# Patient Record
Sex: Male | Born: 1975 | Race: Black or African American | Hispanic: No | Marital: Married | Smoking: Former smoker
Health system: Southern US, Community
[De-identification: ages and names within clinical notes are randomized; demographics above are authoritative.]

## PROBLEM LIST (undated history)

## (undated) DIAGNOSIS — I10 Essential (primary) hypertension: Secondary | ICD-10-CM

---

## 2011-03-28 ENCOUNTER — Encounter: Payer: Self-pay | Admitting: *Deleted

## 2011-03-28 ENCOUNTER — Emergency Department (HOSPITAL_BASED_OUTPATIENT_CLINIC_OR_DEPARTMENT_OTHER)
Admission: EM | Admit: 2011-03-28 | Discharge: 2011-03-29 | Disposition: A | Discharge status: Home / Self Care | Payer: Self-pay | Attending: Emergency Medicine | Admitting: Emergency Medicine

## 2011-03-28 ENCOUNTER — Emergency Department (INDEPENDENT_AMBULATORY_CARE_PROVIDER_SITE_OTHER): Payer: Self-pay

## 2011-03-28 DIAGNOSIS — Z87891 Personal history of nicotine dependence: Secondary | ICD-10-CM

## 2011-03-28 DIAGNOSIS — R05 Cough: Secondary | ICD-10-CM | POA: Insufficient documentation

## 2011-03-28 DIAGNOSIS — R059 Cough, unspecified: Secondary | ICD-10-CM | POA: Insufficient documentation

## 2011-03-28 DIAGNOSIS — R0602 Shortness of breath: Secondary | ICD-10-CM | POA: Insufficient documentation

## 2011-03-28 DIAGNOSIS — J40 Bronchitis, not specified as acute or chronic: Secondary | ICD-10-CM | POA: Insufficient documentation

## 2011-03-28 MED ORDER — AZITHROMYCIN 250 MG PO TABS: 250.0000 mg | ORAL_TABLET | Freq: Every day | ORAL | Status: AC

## 2011-03-28 MED ORDER — ALBUTEROL SULFATE HFA 108 (90 BASE) MCG/ACT IN AERS: 1.0000 | INHALATION_SPRAY | Freq: Four times a day (QID) | RESPIRATORY_TRACT | Status: AC | PRN

## 2011-03-28 NOTE — ED Notes (Signed)
Pt c/o severe cough x 2 weeks

## 2011-03-28 NOTE — ED Notes (Signed)
MD at bedside. 

## 2011-03-28 NOTE — ED Notes (Signed)
PA at bedside to evaluate pt now. 

## 2011-03-28 NOTE — ED Provider Notes (Signed)
History     CSN: [id number] Arrival date & time: 03/28/2011  9:07 PM  Chief Complaint  Patient presents with  . Cough   Patient is a 35 y.o. male presenting with cough. The history is provided by the patient.  Cough This is a new problem. The current episode started more than 1 week ago. The problem occurs constantly. The problem has been gradually worsening. The cough is productive of sputum. There has been no fever. Associated symptoms include shortness of breath. He has tried nothing for the symptoms. The treatment provided no relief. He is not a smoker. His past medical history does not include bronchitis, pneumonia, bronchiectasis, emphysema or asthma.    History reviewed. No pertinent past medical history.  History reviewed. No pertinent past surgical history.  History reviewed. No pertinent family history.  History  Substance Use Topics  . Smoking status: Never Smoker   . Smokeless tobacco: Not on file  . Alcohol Use: No      Review of Systems  Respiratory: Positive for cough and shortness of breath.   All other systems reviewed and are negative.    Physical Exam  BP 121/87  Pulse 73  Temp(Src) 98.4 F (36.9 C) (Oral)  Resp 16  Wt 250 lb (113.399 kg)  SpO2 96%  Physical Exam  Nursing note and vitals reviewed. Constitutional: He is oriented to person, place, and time. He appears well-developed and well-nourished.  HENT:  Head: Normocephalic and atraumatic.  Mouth/Throat: Oropharynx is clear and moist.  Eyes: Conjunctivae are normal. Pupils are equal, round, and reactive to light.  Neck: Normal range of motion. Neck supple.  Cardiovascular: Normal rate.   Pulmonary/Chest: He is in respiratory distress.  Abdominal: Soft.  Musculoskeletal: Normal range of motion.  Neurological: He is alert and oriented to person, place, and time.  Skin: Skin is warm.  Psychiatric: He has a normal mood and affect.    ED Course  Procedures  MDM Chest xray  Negative per  radiologist      Langston Masker, PA 03/29/11 0007

## 2011-03-29 NOTE — ED Provider Notes (Signed)
Medical screening examination/treatment/procedure(s) were performed by non-physician practitioner and as supervising physician I was immediately available for consultation/collaboration.  Geoffery Lyons, MD 03/29/11 [phone]

## 2011-06-10 ENCOUNTER — Encounter (HOSPITAL_BASED_OUTPATIENT_CLINIC_OR_DEPARTMENT_OTHER): Payer: Self-pay | Admitting: *Deleted

## 2011-06-10 ENCOUNTER — Emergency Department (HOSPITAL_BASED_OUTPATIENT_CLINIC_OR_DEPARTMENT_OTHER)
Admission: EM | Admit: 2011-06-10 | Discharge: 2011-06-10 | Disposition: A | Discharge status: Home / Self Care | Payer: Self-pay | Attending: Emergency Medicine | Admitting: Emergency Medicine

## 2011-06-10 DIAGNOSIS — R05 Cough: Secondary | ICD-10-CM

## 2011-06-10 DIAGNOSIS — IMO0001 Reserved for inherently not codable concepts without codable children: Secondary | ICD-10-CM | POA: Insufficient documentation

## 2011-06-10 DIAGNOSIS — R062 Wheezing: Secondary | ICD-10-CM | POA: Insufficient documentation

## 2011-06-10 DIAGNOSIS — R059 Cough, unspecified: Secondary | ICD-10-CM | POA: Insufficient documentation

## 2011-06-10 DIAGNOSIS — R6883 Chills (without fever): Secondary | ICD-10-CM | POA: Insufficient documentation

## 2011-06-10 DIAGNOSIS — J029 Acute pharyngitis, unspecified: Secondary | ICD-10-CM | POA: Insufficient documentation

## 2011-06-10 DIAGNOSIS — R0602 Shortness of breath: Secondary | ICD-10-CM | POA: Insufficient documentation

## 2011-06-10 MED ORDER — ALBUTEROL SULFATE HFA 108 (90 BASE) MCG/ACT IN AERS: 1.0000 | INHALATION_SPRAY | Freq: Four times a day (QID) | RESPIRATORY_TRACT | Status: DC | PRN

## 2011-06-10 MED ORDER — DOXYCYCLINE HYCLATE 100 MG PO CAPS: 100.0000 mg | ORAL_CAPSULE | Freq: Two times a day (BID) | ORAL | Status: AC

## 2011-06-10 NOTE — ED Notes (Signed)
Pt reports 3 weeks of cough producing yellow sputum, denies any fevers or other c/o.

## 2011-06-10 NOTE — ED Provider Notes (Signed)
History     CSN: [id number] Arrival date & time: 06/10/2011  7:47 AM   First MD Initiated Contact with Patient 06/10/11 0750    Chief Complaint  Patient presents with  . Cough    Patient is a 35 y.o. male presenting with cough. The history is provided by the patient.  Cough This is a recurrent problem. The current episode started more than 1 week ago. The problem occurs hourly. The problem has not changed since onset.The cough is productive of sputum. Associated symptoms include chills, sore throat, myalgias, shortness of breath and wheezing. Pertinent negatives include no chest pain and no sweats.   patient has had intermittent cough for 3 weeks.  Cough worse at nights No hemoptysis No dyspnea on exertion No syncope No chest pain Does report intermittent chills Does report nasal congestion He is a smoker No recent travel/surgery  PMH - none Fam Hx - noncontributory  History  Substance Use Topics  . Smoking status: Current Everyday Smoker  . Smokeless tobacco: Not on file  . Alcohol Use: No      Review of Systems  Constitutional: Positive for chills.  HENT: Positive for sore throat.   Respiratory: Positive for cough, shortness of breath and wheezing.   Cardiovascular: Negative for chest pain.  Musculoskeletal: Positive for myalgias.    Allergies  Review of patient's allergies indicates no known allergies.  Home Medications   Current Outpatient Rx  Name Route Sig Dispense Refill  . ALBUTEROL SULFATE HFA 108 (90 BASE) MCG/ACT IN AERS Inhalation Inhale 2 puffs into the lungs every 6 (six) hours as needed. For shortness of breath     . GUAIFENESIN-DM 100-10 MG/5ML PO SYRP Oral Take 10 mLs by mouth 3 (three) times daily as needed. For cough     . ALBUTEROL SULFATE HFA 108 (90 BASE) MCG/ACT IN AERS Inhalation Inhale 1-2 puffs into the lungs every 6 (six) hours as needed for wheezing. 1 Inhaler 0  . ALBUTEROL SULFATE HFA 108 (90 BASE) MCG/ACT IN AERS Inhalation Inhale  1-2 puffs into the lungs every 6 (six) hours as needed for wheezing. 1 Inhaler 0  . DOXYCYCLINE HYCLATE 100 MG PO CAPS Oral Take 1 capsule (100 mg total) by mouth 2 (two) times daily. 14 capsule 0    BP 141/77  Pulse 84  Temp(Src) 98.9 F (37.2 C) (Oral)  Resp 16  SpO2 100%  Physical Exam CONSTITUTIONAL: Well developed/well nourished HEAD AND FACE: Normocephalic/atraumatic EYES: EOMI/PERRL ENMT: Mucous membranes moist, nasal congestion NECK: supple no meningeal signs CV: S1/S2 noted, no murmurs/rubs/gallops noted LUNGS:brief scattered wheezes bilaterally, no rales, no tachypnea, he is able to speak to me clearly ABDOMEN: soft, nontender, no rebound or guarding NEURO: Pt is awake/alert, moves all extremitiesx4 EXTREMITIES: pulses normal, full ROM, no edema SKIN: warm, color normal PSYCH: no abnormalities of mood noted  ED Course  Procedures    1. Cough       MDM  Nursing notes reviewed and considered in documentation Previous records reviewed and considered  Pt well appearing, ambulatory and no SOB on exertion while in the ED Given he has had cough for 3 weeks with greenish sputum, will start abx, give albuterol mdi Suspicion for ACS/CHF/PE is low given history/exam  The patient appears reasonably screened and/or stabilized for discharge and I doubt any other medical condition or other [facility] requiring further screening, evaluation, or treatment in the ED at this time prior to discharge.         Dorinda Hill  Forestine Chute, MD 06/10/11 [phone]

## 2012-01-10 IMAGING — CR DG CHEST 2V
2 series · 2 of 2 positions shown · non-contrast
Comparison: None.

CLINICAL DATA: Productive cough; history of smoking.

CHEST - 2 VIEW

[w chest pa]
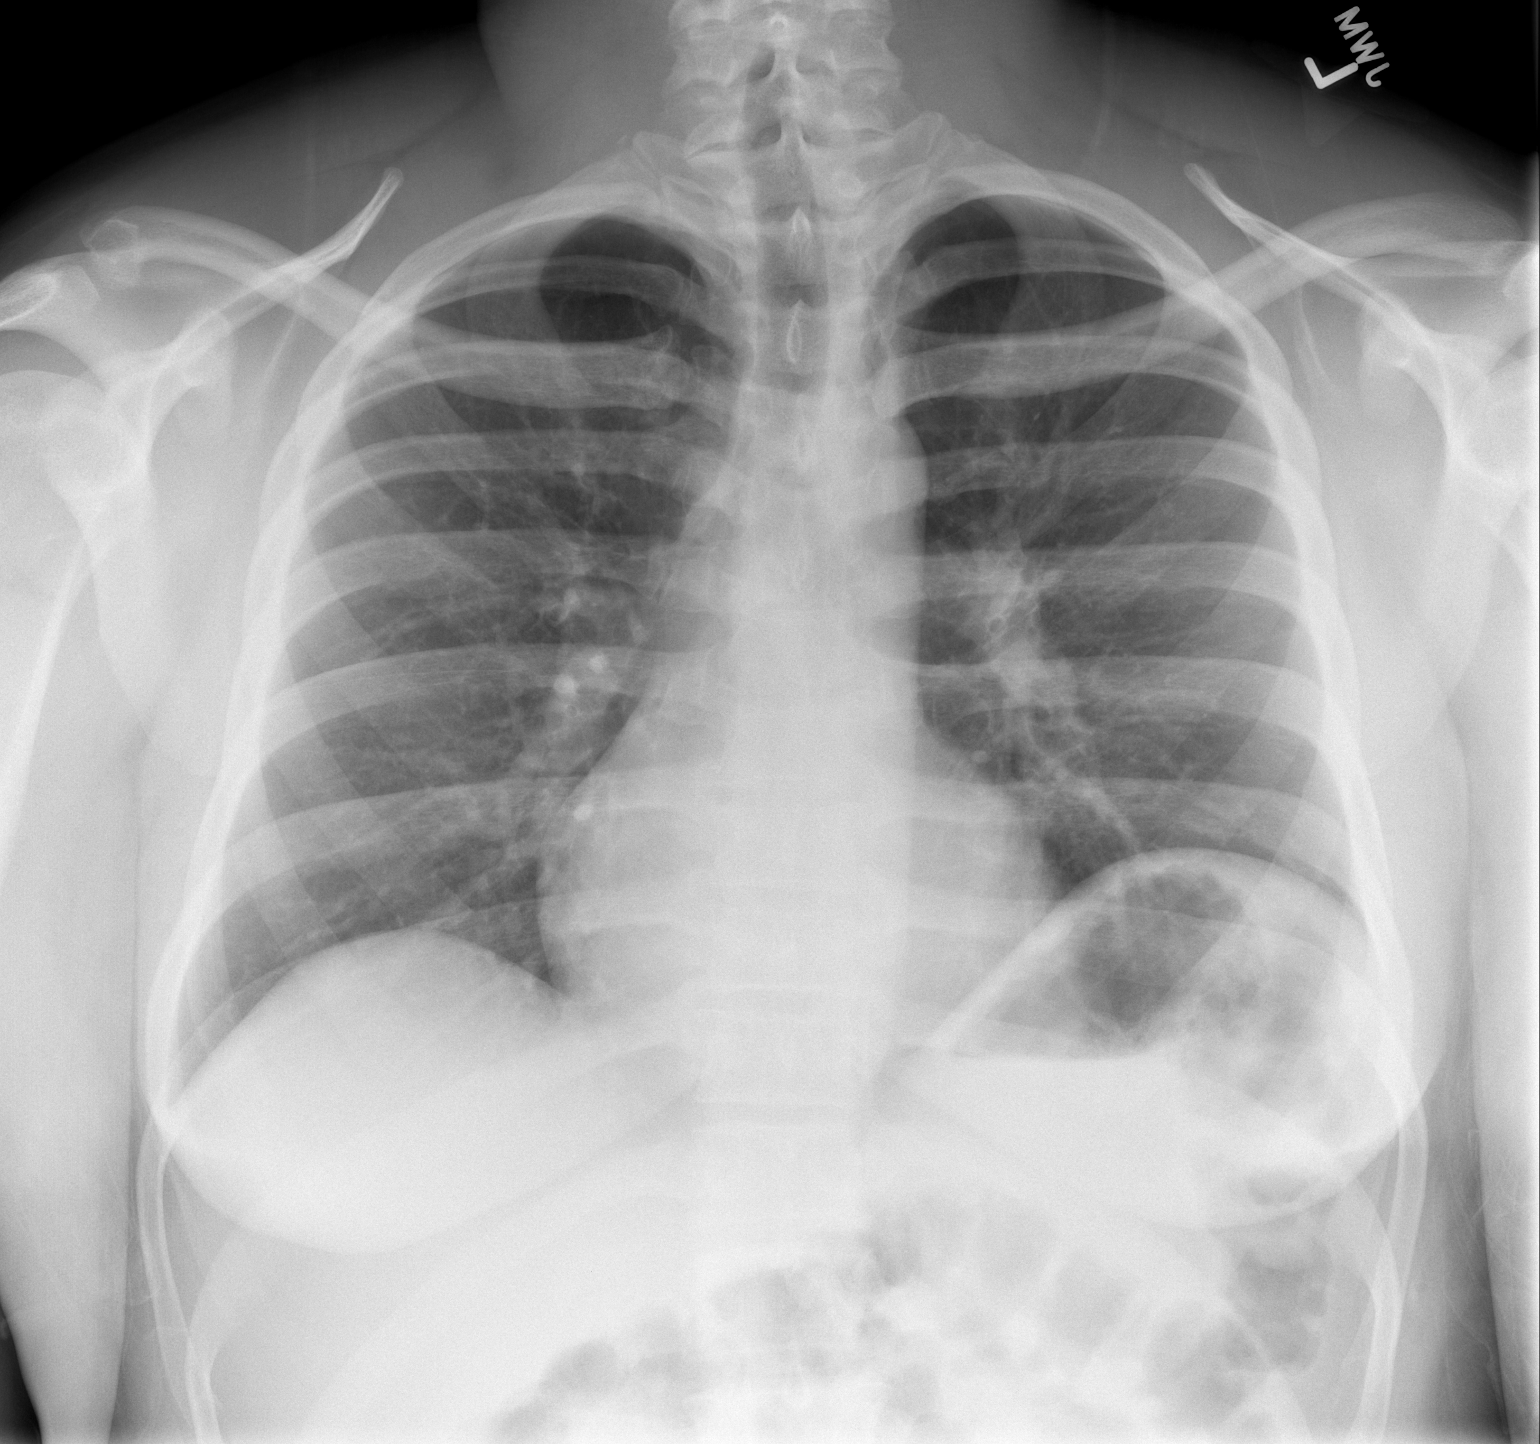

[w chest lat]
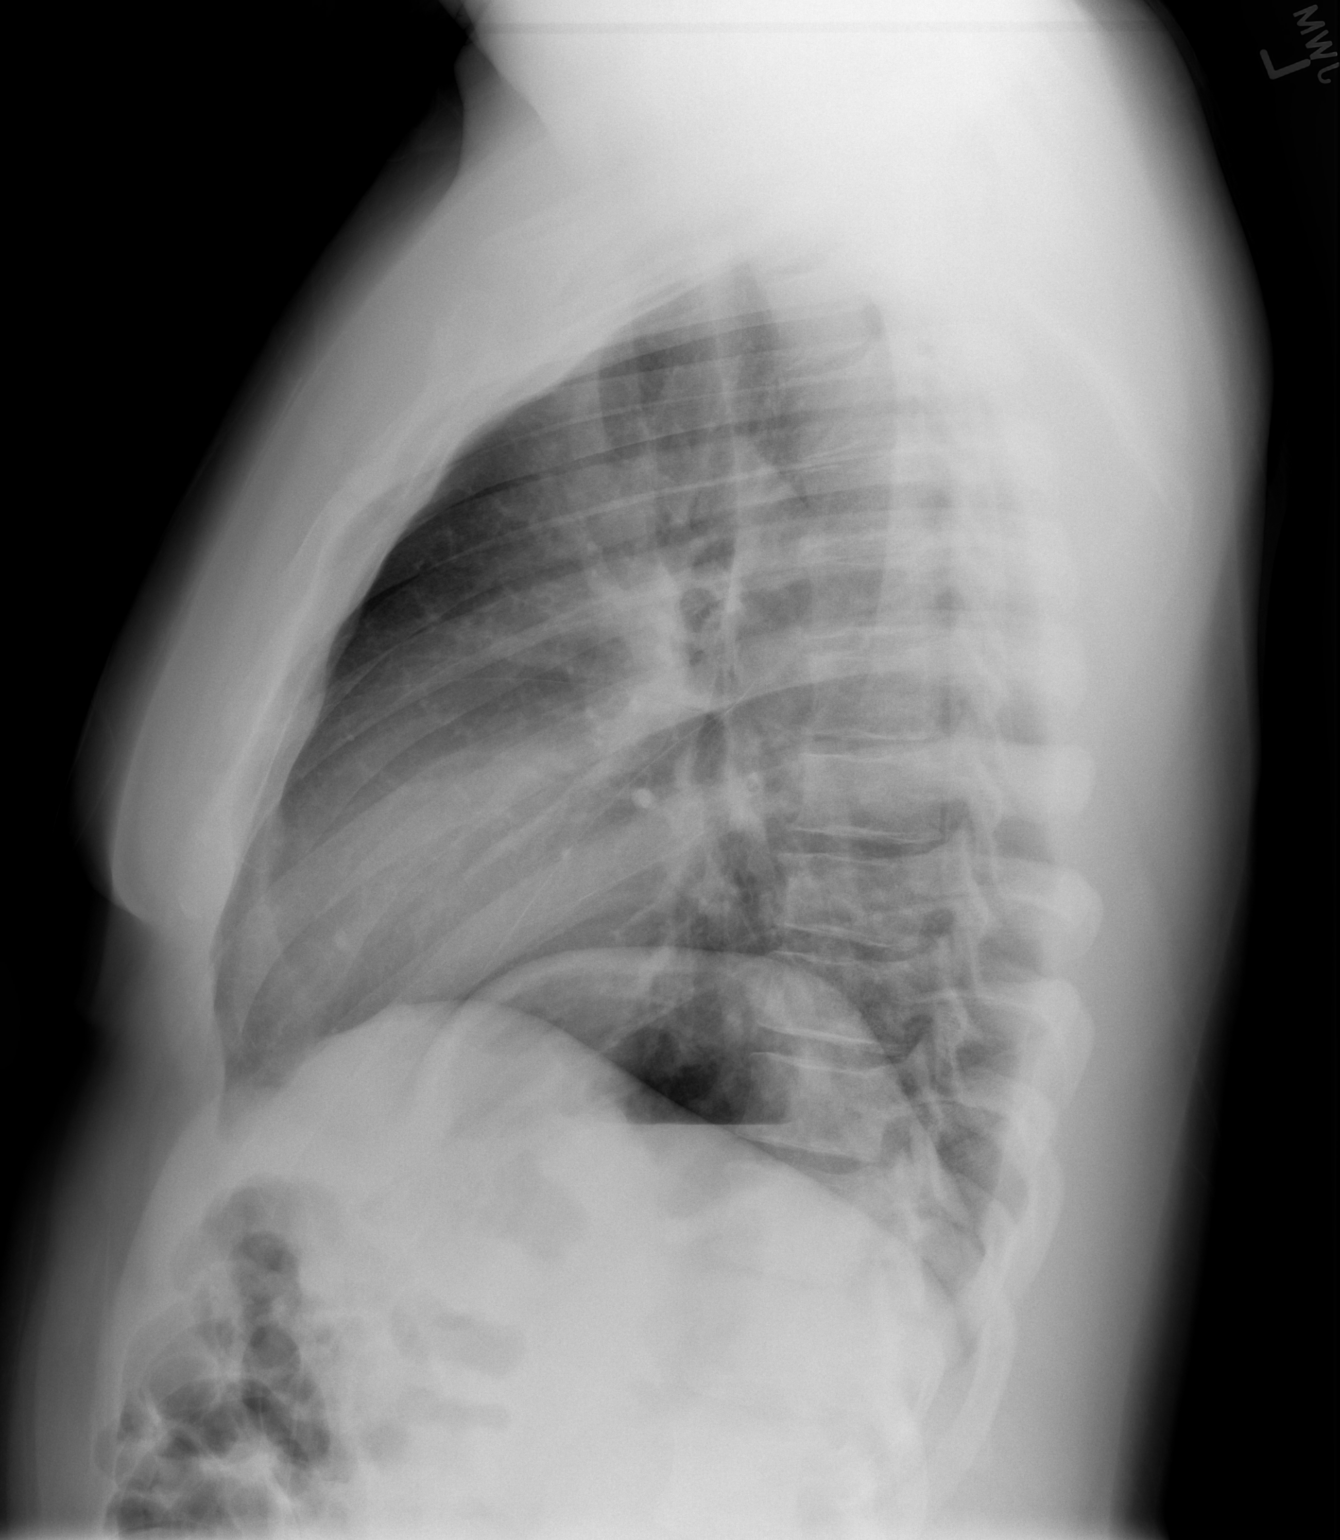

[2 of 2 positions shown; findings below may reference images not displayed]

FINDINGS: The lungs are well-aerated; mild peribronchial thickening
is noted.  There is no evidence of focal opacification, pleural
effusion or pneumothorax.  There is mild elevation of the left
hemidiaphragm.

The heart is normal in size; the mediastinal contour is within
normal limits.  No acute osseous abnormalities are seen.
IMPRESSION: Mild peribronchial thickening noted; lungs otherwise clear.

## 2012-03-21 ENCOUNTER — Emergency Department (HOSPITAL_BASED_OUTPATIENT_CLINIC_OR_DEPARTMENT_OTHER)
Admission: EM | Admit: 2012-03-21 | Discharge: 2012-03-21 | Disposition: A | Discharge status: Home / Self Care | Payer: Self-pay | Attending: Emergency Medicine | Admitting: Emergency Medicine

## 2012-03-21 ENCOUNTER — Encounter (HOSPITAL_BASED_OUTPATIENT_CLINIC_OR_DEPARTMENT_OTHER): Payer: Self-pay | Admitting: Emergency Medicine

## 2012-03-21 DIAGNOSIS — M25519 Pain in unspecified shoulder: Secondary | ICD-10-CM | POA: Insufficient documentation

## 2012-03-21 DIAGNOSIS — M542 Cervicalgia: Secondary | ICD-10-CM | POA: Insufficient documentation
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
## 2012-04-04 ENCOUNTER — Emergency Department (HOSPITAL_BASED_OUTPATIENT_CLINIC_OR_DEPARTMENT_OTHER)
Admission: EM | Admit: 2012-04-04 | Discharge: 2012-04-04 | Disposition: A | Discharge status: Home / Self Care | Payer: Self-pay | Attending: Emergency Medicine | Admitting: Emergency Medicine

## 2012-04-04 ENCOUNTER — Encounter (HOSPITAL_BASED_OUTPATIENT_CLINIC_OR_DEPARTMENT_OTHER): Payer: Self-pay | Admitting: *Deleted

## 2012-04-04 DIAGNOSIS — G540 Brachial plexus disorders: Secondary | ICD-10-CM | POA: Insufficient documentation

## 2012-04-04 DIAGNOSIS — F17200 Nicotine dependence, unspecified, uncomplicated: Secondary | ICD-10-CM | POA: Insufficient documentation

## 2012-04-04 MED ORDER — DIAZEPAM 5 MG PO TABS
5.0000 mg | ORAL_TABLET | Freq: Once | ORAL | Status: AC
  Administered 2012-04-04: 5 mg via ORAL
  Filled 2012-04-04: qty 1

## 2012-04-04 MED ORDER — DIAZEPAM 5 MG PO TABS: 5.0000 mg | ORAL_TABLET | Freq: Two times a day (BID) | ORAL | Status: AC

## 2012-04-04 MED ORDER — IBUPROFEN 600 MG PO TABS: 600.0000 mg | ORAL_TABLET | Freq: Three times a day (TID) | ORAL | Status: AC

## 2012-04-04 MED ORDER — KETOROLAC TROMETHAMINE 30 MG/ML IJ SOLN
30.0000 mg | Freq: Once | INTRAMUSCULAR | Status: AC
  Administered 2012-04-04: 30 mg via INTRAMUSCULAR
  Filled 2012-04-04: qty 1
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
## 2012-04-04 NOTE — ED Notes (Signed)
Pt c/o neck pain which radiates down both arms with numbness at times x 2 months

## 2014-02-12 ENCOUNTER — Encounter (HOSPITAL_BASED_OUTPATIENT_CLINIC_OR_DEPARTMENT_OTHER): Payer: Self-pay | Admitting: Emergency Medicine

## 2014-02-12 ENCOUNTER — Emergency Department (HOSPITAL_BASED_OUTPATIENT_CLINIC_OR_DEPARTMENT_OTHER)
Admission: EM | Admit: 2014-02-12 | Discharge: 2014-02-12 | Disposition: A | Discharge status: Home / Self Care | Payer: 59 | Attending: Emergency Medicine | Admitting: Emergency Medicine

## 2014-02-12 DIAGNOSIS — M545 Low back pain, unspecified: Secondary | ICD-10-CM | POA: Insufficient documentation

## 2014-02-12 DIAGNOSIS — R42 Dizziness and giddiness: Secondary | ICD-10-CM | POA: Insufficient documentation

## 2014-02-12 DIAGNOSIS — Z87891 Personal history of nicotine dependence: Secondary | ICD-10-CM | POA: Insufficient documentation

## 2014-02-12 DIAGNOSIS — R209 Unspecified disturbances of skin sensation: Secondary | ICD-10-CM | POA: Insufficient documentation

## 2014-02-12 DIAGNOSIS — R0602 Shortness of breath: Secondary | ICD-10-CM | POA: Insufficient documentation

## 2014-02-12 DIAGNOSIS — R079 Chest pain, unspecified: Secondary | ICD-10-CM | POA: Insufficient documentation

## 2014-02-12 DIAGNOSIS — R110 Nausea: Secondary | ICD-10-CM | POA: Insufficient documentation

## 2014-02-12 DIAGNOSIS — I10 Essential (primary) hypertension: Secondary | ICD-10-CM

## 2014-02-12 DIAGNOSIS — Z79899 Other long term (current) drug therapy: Secondary | ICD-10-CM | POA: Insufficient documentation

## 2014-02-12 HISTORY — DX: Essential (primary) hypertension: I10

## 2014-02-12 LAB — URINALYSIS, ROUTINE W REFLEX MICROSCOPIC
BILIRUBIN URINE: NEGATIVE
Glucose, UA: NEGATIVE mg/dL
HGB URINE DIPSTICK: NEGATIVE
Ketones, ur: NEGATIVE mg/dL
Leukocytes, UA: NEGATIVE
NITRITE: NEGATIVE
PROTEIN: NEGATIVE mg/dL
SPECIFIC GRAVITY, URINE: 1.025 (ref 1.005–1.030)
UROBILINOGEN UA: 0.2 mg/dL (ref 0.0–1.0)
pH: 5.5 (ref 5.0–8.0)

## 2014-02-12 MED ORDER — IBUPROFEN 400 MG PO TABS
600.0000 mg | ORAL_TABLET | Freq: Once | ORAL | Status: AC
  Administered 2014-02-12: 600 mg via ORAL
  Filled 2014-02-12 (×2): qty 1

## 2014-02-12 MED ORDER — KETOROLAC TROMETHAMINE 60 MG/2ML IM SOLN
60.0000 mg | Freq: Once | INTRAMUSCULAR | Status: DC
  Filled 2014-02-12: qty 2

## 2014-02-12 NOTE — ED Provider Notes (Signed)
CSN: [id number]     Arrival date & time 02/12/14  2114 History  This chart was scribed for Jacob Christensen, * by Jacob Christensen, ED scribe. This patient was seen in room MH07/MH07 and the patient's care was started at 10:26 PM.    Chief Complaint  Patient presents with  . Back Pain    Patient is a 38 y.o. male presenting with back pain. The history is provided by the patient. No language interpreter was used.  Back Pain Location:  Lumbar spine Radiates to:  L thigh, R thigh, L knee, R knee, L foot and R foot Pain severity:  Moderate Pain is:  Same all the time Onset quality:  Gradual Duration:  2 days Timing:  Constant Progression:  Worsening Chronicity:  New Context: lifting heavy objects and physical stress   Relieved by:  Lying down Worsened by:  Movement, standing, ambulation and twisting Associated symptoms: chest pain, leg pain and tingling   Associated symptoms: no abdominal pain, no bladder incontinence, no bowel incontinence, no fever, no pelvic pain and no weakness    HPI Comments: Jacob Christensen is a 38 y.o. male who presents to the Emergency Department with 2 complaints. Reports gradually worsening constant central lower back pain radiating to bilateral lower extremities; onset 2 day ago. Back pain began 2 day ago. Better when laying on side. Some intermittent sharp chest pain, SOB, and dizziness today and yesterday while at work; states it is worse while he was lifting and unloading boxes at work. States he had to stop work for a little bit to drink water. Also reports intermittent tingling in his bilateral toes. No loss of bowel/bladder control; but states he had to jump up and run to the bathroom last night. Reports no difficulty ambulating.   Also reports elevated BP; controlling with Lisinopril prescribed to him during an ED visit. He notes a BP reading today at work of at 160/98; has since gone down. States he could tell it was higher before checking because  he was lightheaded, shaking, and felt dizzy. Spoke with his boss who told him to follow up with his PCP; states he got off of work too late tonight to see a doctor so came to the ED. States his job includes heavy lifting and is a high stress and hot environment; believes these factors are contributing to current back pain and elevated BP. No other medical problems. Family hx of diabetes. Denies numbness in groin, loss of bowel/bladder control, abdominal pain, testicular pain, or vomiting.  Past Medical History  Diagnosis Date  . Hypertension    History reviewed. No pertinent past surgical history. No family history on file. History  Substance Use Topics  . Smoking status: Former Smoker -- 0.50 packs/day  . Smokeless tobacco: Never Used  . Alcohol Use: No    Review of Systems  Constitutional: Negative for fever.  Respiratory: Positive for shortness of breath.   Cardiovascular: Positive for chest pain.  Gastrointestinal: Positive for nausea. Negative for vomiting, abdominal pain and bowel incontinence.  Genitourinary: Negative for bladder incontinence, testicular pain and pelvic pain.  Musculoskeletal: Positive for back pain. Negative for gait problem.  Neurological: Positive for dizziness and tingling. Negative for weakness and light-headedness.  All other systems reviewed and are negative.  Allergies  Review of patient's allergies indicates no known allergies.  Home Medications   Prior to Admission medications   Medication Sig Start Date End Date Taking? Authorizing Provider  lisinopril (PRINIVIL,ZESTRIL) 10 MG tablet  Take 10 mg by mouth daily.   Yes Historical Provider, MD  albuterol (PROVENTIL HFA;VENTOLIN HFA) 108 (90 BASE) MCG/ACT inhaler Inhale 1-2 puffs into the lungs every 6 (six) hours as needed for wheezing. 03/28/11 03/27/12  Elson AreasLeslie K Sofia, PA-C  guaiFENesin-dextromethorphan (ROBITUSSIN DM) 100-10 MG/5ML syrup Take 10 mLs by mouth 3 (three) times daily as needed. For cough      Historical Provider, MD   Triage vitals: BP 131/58  Pulse 56  Temp(Src) 98.5 F (36.9 C) (Oral)  Resp 18  Ht 6\' 2"  (1.88 m)  Wt 225 lb (102.059 kg)  BMI 28.88 kg/m2  SpO2 99%  Physical Exam  Nursing note and vitals reviewed. Constitutional: He is oriented to person, place, and time. He appears well-developed and well-nourished. No distress.  HENT:  Head: Normocephalic and atraumatic.  Mouth/Throat: Oropharynx is clear and moist.  Eyes: Conjunctivae are normal. Pupils are equal, round, and reactive to light. No scleral icterus.  Neck: Neck supple.  Cardiovascular: Normal rate, regular rhythm, normal heart sounds and intact distal pulses.   No murmur heard. Pulmonary/Chest: Effort normal and breath sounds normal. No stridor. No respiratory distress. He has no wheezes. He has no rales.  Abdominal: Soft. He exhibits no distension. There is no tenderness.  Musculoskeletal: He exhibits no edema.       Lumbar back: He exhibits tenderness and bony tenderness. He exhibits normal range of motion.  Neurological: He is alert and oriented to person, place, and time. He has normal strength. Gait normal.  Normal bilateral lower extremity strength  Skin: Skin is warm and dry. No rash noted.  Psychiatric: He has a normal mood and affect. His behavior is normal.    ED Course  Procedures (including critical care time) DIAGNOSTIC STUDIES: Oxygen Saturation is 99% on room air, normal by my interpretation.    COORDINATION OF CARE: At 10:40 PM: Discussed treatment plan with patient which includes anti-inflammatory medication and rest for back pain. Will also perform EKG. Patient agrees.   Labs Review Labs Reviewed  URINALYSIS, ROUTINE W REFLEX MICROSCOPIC   Imaging Review No results found.   EKG Interpretation   Date/Time:  Wednesday February 12 2014 22:57:15 EDT Ventricular Rate:  57 PR Interval:  176 QRS Duration: 84 QT Interval:  380 QTC Calculation: 369 R Axis:   42 Text  Interpretation:  Sinus bradycardia Otherwise normal ECG No old  tracing to compare Confirmed by [facility] LLCWOFFORD  MD, TREY ([id number]) on 02/12/2014  11:29:01 PM      MDM   Final diagnoses:  Midline low back pain without sciatica  Essential hypertension   38 year old male presenting with low back pain and high blood pressure. Regarding his low back pain, likely related to muscle strain from recent heavy lifting. No red flag signs or symptoms of serious spine pathology.  Plan to treat with NSAIDs.  He also complains of high blood pressure. He states he knew his blood pressure was high because while at work he felt a little light headed and had to rest and drink fluids. I do not think the symptoms were secondary to acute hypertensive emergency. More likely, they are related to his strenuous job and the heat. He feels much better now after resting.  EKG checked and normal. No family history of sudden cardiac death. Advised that he drink plenty of fluids and take breaks as needed. He will followup with his primary doctor regarding his blood pressure medications.  I personally performed the services described in this documentation,  which was scribed in my presence. The recorded information has been reviewed and is accurate.      Jacob Churn III, MD 02/12/14 [phone]

## 2014-02-12 NOTE — ED Notes (Signed)
MD at bedside. 

## 2014-02-12 NOTE — Discharge Instructions (Signed)
Back Injury Prevention Back injuries can be extremely painful and difficult to heal. After having one back injury, you are much more likely to experience another later on. It is important to learn how to avoid injuring or re-injuring your back. The following tips can help you to prevent a back injury. PHYSICAL FITNESS  Exercise regularly and try to develop good tone in your abdominal muscles. Your abdominal muscles provide a lot of the support needed by your back.  Do aerobic exercises (walking, jogging, biking, swimming) regularly.  Do exercises that increase balance and strength (tai chi, yoga) regularly. This can decrease your risk of falling and injuring your back.  Stretch before and after exercising.  Maintain a healthy weight. The more you weigh, the more stress is placed on your back. For every pound of weight, 10 times that amount of pressure is placed on the back. DIET  Talk to your caregiver about how much calcium and vitamin D you need per day. These nutrients help to prevent weakening of the bones (osteoporosis). Osteoporosis can cause broken (fractured) bones that lead to back pain.  Include good sources of calcium in your diet, such as dairy products, green, leafy vegetables, and products with calcium added (fortified).  Include good sources of vitamin D in your diet, such as milk and foods that are fortified with vitamin D.  Consider taking a nutritional supplement or a multivitamin if needed.  Stop smoking if you smoke. POSTURE  Sit and stand up straight. Avoid leaning forward when you sit or hunching over when you stand.  Choose chairs with good low back (lumbar) support.  If you work at a desk, sit close to your work so you do not need to lean over. Keep your chin tucked in. Keep your neck drawn back and elbows bent at a right angle. Your arms should look like the letter "L."  Sit high and close to the steering wheel when you drive. Add a lumbar support to your car  seat if needed.  Avoid sitting or standing in one position for too long. Take breaks to get up, stretch, and walk around at least once every hour. Take breaks if you are driving for long periods of time.  Sleep on your side with your knees slightly bent, or sleep on your back with a pillow under your knees. Do not sleep on your stomach. LIFTING, TWISTING, AND REACHING  Avoid heavy lifting, especially repetitive lifting. If you must do heavy lifting:  Stretch before lifting.  Work slowly.  Rest between lifts.  Use carts and dollies to move objects when possible.  Make several small trips instead of carrying 1 heavy load.  Ask for help when you need it.  Ask for help when moving big, awkward objects.  Follow these steps when lifting:  Stand with your feet shoulder-width apart.  Get as close to the object as you can. Do not try to pick up heavy objects that are far from your body.  Use handles or lifting straps if they are available.  Bend at your knees. Squat down, but keep your heels off the floor.  Keep your shoulders pulled back, your chin tucked in, and your back straight.  Lift the object slowly, tightening the muscles in your legs, abdomen, and buttocks. Keep the object as close to the center of your body as possible.  When you put a load down, use these same guidelines in reverse.  Do not:  Lift the object above your waist.  Twist at the waist while lifting or carrying a load. Move your feet if you need to turn, not your waist.  Bend over without bending at your knees.  Avoid reaching over your head, across a table, or for an object on a high surface. OTHER TIPS  Avoid wet floors and keep sidewalks clear of ice to prevent falls.  Do not sleep on a mattress that is too soft or too hard.  Keep items that are used frequently within easy reach.  Put heavier objects on shelves at waist level and lighter objects on lower or higher shelves.  Find ways to  decrease your stress, such as exercise, massage, or relaxation techniques. Stress can build up in your muscles. Tense muscles are more vulnerable to injury.  Seek treatment for depression or anxiety if needed. These conditions can increase your risk of developing back pain. SEEK MEDICAL CARE IF:  You injure your back.  You have questions about diet, exercise, or other ways to prevent back injuries. MAKE SURE YOU:  Understand these instructions.  Will watch your condition.  Will get help right away if you are not doing well or get worse. Document Released: 09/01/2004 Document Revised: 10/17/2011 Document Reviewed: 09/05/2011 [employer] Patient Information 2015 [employer]. This information is not intended to replace advice given to you by your health care provider. Make sure you discuss any questions you have with your health care provider. Back Pain, Adult Low back pain is very common. About 1 in 5 people have back pain.The cause of low back pain is rarely dangerous. The pain often gets better over time.About half of people with a sudden onset of back pain feel better in just 2 weeks. About 8 in 10 people feel better by 6 weeks.  CAUSES Some common causes of back pain include:  Strain of the muscles or ligaments supporting the spine.  Wear and tear (degeneration) of the spinal discs.  Arthritis.  Direct injury to the back. DIAGNOSIS Most of the time, the direct cause of low back pain is not known.However, back pain can be treated effectively even when the exact cause of the pain is unknown.Answering your caregiver's questions about your overall health and symptoms is one of the most accurate ways to make sure the cause of your pain is not dangerous. If your caregiver needs more information, he or she may order lab work or imaging tests (X-rays or MRIs).However, even if imaging tests show changes in your back, this usually does not require surgery. HOME CARE INSTRUCTIONS For  many people, back pain returns.Since low back pain is rarely dangerous, it is often a condition that people can learn to manageon their own.   Remain active. It is stressful on the back to sit or stand in one place. Do not sit, drive, or stand in one place for more than 30 minutes at a time. Take short walks on level surfaces as soon as pain allows.Try to increase the length of time you walk each day.  Do not stay in bed.Resting more than 1 or 2 days can delay your recovery.  Do not avoid exercise or work.Your body is made to move.It is not dangerous to be active, even though your back may hurt.Your back will likely heal faster if you return to being active before your pain is gone.  Pay attention to your body when you bend and lift. Many people have less discomfortwhen lifting if they bend their knees, keep the load close to their bodies,and avoid   avoid twisting. Often, the most comfortable positions are those that put less stress on your recovering back.  Find a comfortable position to sleep. Use a firm mattress and lie on your side with your knees slightly bent. If you lie on your back, put a pillow under your knees.  Only take over-the-counter or prescription medicines as directed by your caregiver. Over-the-counter medicines to reduce pain and inflammation are often the most helpful.Your caregiver may prescribe muscle relaxant drugs.These medicines help dull your pain so you can more quickly return to your normal activities and healthy exercise.  Put ice on the injured area.  Put ice in a plastic bag.  Place a towel between your skin and the bag.  Leave the ice on for 15-20 minutes, 03-04 times a day for the first 2 to 3 days. After that, ice and heat may be alternated to reduce pain and spasms.  Ask your caregiver about trying back exercises and gentle massage. This may be of some benefit.  Avoid feeling anxious or stressed.Stress increases muscle tension and can worsen back  pain.It is important to recognize when you are anxious or stressed and learn ways to manage it.Exercise is a great option. SEEK MEDICAL CARE IF:  You have pain that is not relieved with rest or medicine.  You have pain that does not improve in 1 week.  You have new symptoms.  You are generally not feeling well. SEEK IMMEDIATE MEDICAL CARE IF:   You have pain that radiates from your back into your legs.  You develop new bowel or bladder control problems.  You have unusual weakness or numbness in your arms or legs.  You develop nausea or vomiting.  You develop abdominal pain.  You feel faint. Document Released: 07/25/2005 Document Revised: 01/24/2012 Document Reviewed: 12/13/2010 [employer] Patient Information 2015 [place], [place]. This information is not intended to replace advice given to you by your health care provider. Make sure you discuss any questions you have with your health care provider.  Hypertension Hypertension, commonly called high blood pressure, is when the force of blood pumping through your arteries is too strong. Your arteries are the blood vessels that carry blood from your heart throughout your body. A blood pressure reading consists of a higher number over a lower number, such as 110/72. The higher number (systolic) is the pressure inside your arteries when your heart pumps. The lower number (diastolic) is the pressure inside your arteries when your heart relaxes. Ideally you want your blood pressure below 120/80. Hypertension forces your heart to work harder to pump blood. Your arteries may become narrow or stiff. Having hypertension puts you at risk for heart disease, stroke, and other problems.  RISK FACTORS Some risk factors for high blood pressure are controllable. Others are not.  Risk factors you cannot control include:   Race. You may be at higher risk if you are African American.  Age. Risk increases with age.  Gender. Men are at higher risk than  women before age 61 years. After age 52, women are at higher risk than men. Risk factors you can control include:  Not getting enough exercise or physical activity.  Being overweight.  Getting too much fat, sugar, calories, or salt in your diet.  Drinking too much alcohol. SIGNS AND SYMPTOMS Hypertension does not usually cause signs or symptoms. Extremely high blood pressure (hypertensive crisis) may cause headache, anxiety, shortness of breath, and nosebleed. DIAGNOSIS  To check if you have hypertension, your health care provider will  measure your blood pressure while you are seated, with your arm held at the level of your heart. It should be measured at least twice using the same arm. Certain conditions can cause a difference in blood pressure between your right and left arms. A blood pressure reading that is higher than normal on one occasion does not mean that you need treatment. If one blood pressure reading is high, ask your health care provider about having it checked again. TREATMENT  Treating high blood pressure includes making lifestyle changes and possibly taking medication. Living a healthy lifestyle can help lower high blood pressure. You may need to change some of your habits. Lifestyle changes may include:  Following the DASH diet. This diet is high in fruits, vegetables, and whole grains. It is low in salt, red meat, and added sugars.  Getting at least 2 1/2 hours of brisk physical activity every week.  Losing weight if necessary.  Not smoking.  Limiting alcoholic beverages.  Learning ways to reduce stress. If lifestyle changes are not enough to get your blood pressure under control, your health care provider may prescribe medicine. You may need to take more than one. Work closely with your health care provider to understand the risks and benefits. HOME CARE INSTRUCTIONS  Have your blood pressure rechecked as directed by your health care provider.   Only take  medicine as directed by your health care provider. Follow the directions carefully. Blood pressure medicines must be taken as prescribed. The medicine does not work as well when you skip doses. Skipping doses also puts you at risk for problems.   Do not smoke.   Monitor your blood pressure at home as directed by your health care provider. SEEK MEDICAL CARE IF:   You think you are having a reaction to medicines taken.  You have recurrent headaches or feel dizzy.  You have swelling in your ankles.  You have trouble with your vision. SEEK IMMEDIATE MEDICAL CARE IF:  You develop a severe headache or confusion.  You have unusual weakness, numbness, or feel faint.  You have severe chest or abdominal pain.  You vomit repeatedly.  You have trouble breathing. MAKE SURE YOU:   Understand these instructions.  Will watch your condition.  Will get help right away if you are not doing well or get worse. Document Released: 07/25/2005 Document Revised: 07/30/2013 Document Reviewed: 05/17/2013 [facility] Patient Information 2015 [place], [place]. This information is not intended to replace advice given to you by your health care provider. Make sure you discuss any questions you have with your health care provider.

## 2014-02-12 NOTE — ED Notes (Signed)
C/o woke with lower back pain-denies injury-also c/o elevated BP today-reports is compliant with BP meds

## 2016-10-07 ENCOUNTER — Encounter (HOSPITAL_BASED_OUTPATIENT_CLINIC_OR_DEPARTMENT_OTHER): Payer: Self-pay | Admitting: Emergency Medicine

## 2016-10-07 DIAGNOSIS — Y929 Unspecified place or not applicable: Secondary | ICD-10-CM | POA: Insufficient documentation

## 2016-10-07 DIAGNOSIS — Z79899 Other long term (current) drug therapy: Secondary | ICD-10-CM | POA: Diagnosis not present

## 2016-10-07 DIAGNOSIS — S00412A Abrasion of left ear, initial encounter: Secondary | ICD-10-CM | POA: Insufficient documentation

## 2016-10-07 DIAGNOSIS — W268XXA Contact with other sharp object(s), not elsewhere classified, initial encounter: Secondary | ICD-10-CM | POA: Insufficient documentation

## 2016-10-07 DIAGNOSIS — S00402A Unspecified superficial injury of left ear, initial encounter: Secondary | ICD-10-CM | POA: Diagnosis present

## 2016-10-07 DIAGNOSIS — Y9389 Activity, other specified: Secondary | ICD-10-CM | POA: Insufficient documentation

## 2016-10-07 DIAGNOSIS — I10 Essential (primary) hypertension: Secondary | ICD-10-CM | POA: Diagnosis not present

## 2016-10-07 DIAGNOSIS — Y990 Civilian activity done for income or pay: Secondary | ICD-10-CM | POA: Diagnosis not present

## 2016-10-07 DIAGNOSIS — Z87891 Personal history of nicotine dependence: Secondary | ICD-10-CM | POA: Diagnosis not present

## 2016-10-07 DIAGNOSIS — R0602 Shortness of breath: Secondary | ICD-10-CM | POA: Diagnosis not present

## 2016-10-07 NOTE — ED Triage Notes (Signed)
Patient states that he stuck something in his ear to itch it yesterday and it blew away and it hurt his left inner ear. The patient reports that he thought he saw some blood now

## 2016-10-08 ENCOUNTER — Encounter (HOSPITAL_BASED_OUTPATIENT_CLINIC_OR_DEPARTMENT_OTHER): Payer: Self-pay | Admitting: Emergency Medicine

## 2016-10-08 ENCOUNTER — Emergency Department (HOSPITAL_BASED_OUTPATIENT_CLINIC_OR_DEPARTMENT_OTHER)
Admission: EM | Admit: 2016-10-08 | Discharge: 2016-10-08 | Disposition: A | Discharge status: Home / Self Care | Payer: 59 | Attending: Emergency Medicine | Admitting: Emergency Medicine

## 2016-10-08 DIAGNOSIS — T148XXA Other injury of unspecified body region, initial encounter: Secondary | ICD-10-CM

## 2016-10-08 MED ORDER — CIPROFLOXACIN-DEXAMETHASONE 0.3-0.1 % OT SUSP: 4.0000 [drp] | Freq: Two times a day (BID) | OTIC | 0 refills | Status: AC

## 2016-10-08 NOTE — ED Provider Notes (Signed)
[facility]   CSN: [id number] Arrival date & time: 10/07/16  2244     History   Chief Complaint Chief Complaint  Patient presents with  . Otalgia    HPI Jacob Christensen is a 41 y.o. male.  The history is provided by the patient.  Otalgia  This is a new problem. The current episode started 6 to 12 hours ago. There is pain in the left ear. The problem occurs constantly. The problem has not changed since onset.There has been no fever. The pain is moderate. Pertinent negatives include no ear discharge. Associated symptoms comments: Scratched the left canal with his plastic safety glasses. His past medical history does not include tympanostomy tube.    Past Medical History:  Diagnosis Date  . Hypertension     There are no active problems to display for this patient.   History reviewed. No pertinent surgical history.     Home Medications    Prior to Admission medications   Medication Sig Start Date End Date Taking? Authorizing Provider  albuterol (PROVENTIL HFA;VENTOLIN HFA) 108 (90 BASE) MCG/ACT inhaler Inhale 1-2 puffs into the lungs every 6 (six) hours as needed for wheezing. 03/28/11 03/27/12  Elson Areas, PA-C  guaiFENesin-dextromethorphan (ROBITUSSIN DM) 100-10 MG/5ML syrup Take 10 mLs by mouth 3 (three) times daily as needed. For cough     Historical Provider, MD  lisinopril (PRINIVIL,ZESTRIL) 10 MG tablet Take 10 mg by mouth daily.    Historical Provider, MD    Family History History reviewed. No pertinent family history.  Social History Social History  Substance Use Topics  . Smoking status: Former Smoker    Packs/day: 0.50  . Smokeless tobacco: Never Used  . Alcohol use No     Allergies   Patient has no known allergies.   Review of Systems Review of Systems  Constitutional: Negative for fever.  HENT: Positive for ear pain. Negative for drooling and ear discharge.   Respiratory: Positive for shortness of breath.     Cardiovascular: Negative for chest pain.  All other systems reviewed and are negative.    Physical Exam Updated Vital Signs BP 137/81 (BP Location: Right Arm)   Pulse (!) 56   Temp 98.5 F (36.9 C) (Oral)   Resp 16   Ht 6\' 2"  (1.88 m)   Wt 256 lb (116.1 kg)   SpO2 97%   BMI 32.87 kg/m   Physical Exam  Constitutional: He appears well-developed and well-nourished. No distress.  HENT:  Head: Normocephalic and atraumatic.  Right Ear: Tympanic membrane is not injected, not scarred, not perforated and not erythematous. No hemotympanum.  Left Ear: Tympanic membrane is not injected, not scarred, not perforated and not erythematous. No hemotympanum.  Mouth/Throat: No oropharyngeal exudate.  Abrasion to the left canal floor  Eyes: Conjunctivae and EOM are normal. Pupils are equal, round, and reactive to light.  Neck: Normal range of motion. Neck supple.  Cardiovascular: Normal rate, regular rhythm and intact distal pulses.   Pulmonary/Chest: Effort normal and breath sounds normal.     ED Treatments / Results   Vitals:   10/07/16 2253 10/08/16 0044  BP: 124/80 137/81  Pulse: 78 (!) 56  Resp: 18 16  Temp: 98.5 F (36.9 C)     Radiology No results found.  Procedures Procedures (including critical care time)    Final Clinical Impressions(s) / ED Diagnoses  Canal abrasion: RX for ear drops.  The patient is nontoxic-appearing on exam and vital signs  are within normal limits.     After history, exam, and medical workup I feel the patient has been appropriately medically screened and is safe for discharge home. Pertinent diagnoses were discussed with the patient. Patient was given return precautions.  Final Clinical Impressions(s) / ED Diagnoses   Dental caries: The patient is nontoxic-appearing on exam and vital signs are normal. After history, exam, and medical workup I feel the patient has been appropriately medically screened and is safe for discharge home.  Pertinent diagnoses were discussed with the patient. Patient was given return precautions.  Strict return precautions for fever, facial swelling, ear discharge diminished hearing or any concerns.  Follow up with your doctor for recheck New Prescriptions New Prescriptions   No medications on file     Kisa Fujii, MD 10/08/16 [phone]

## 2018-01-04 ENCOUNTER — Ambulatory Visit: Payer: Self-pay | Admitting: Urgent Care
